# Patient Record
Sex: Male | Born: 1970 | Race: Black or African American | Hispanic: No | Marital: Single | Smoking: Never smoker
Health system: Southern US, Community
[De-identification: ages and names within clinical notes are randomized; demographics above are authoritative.]

## PROBLEM LIST (undated history)

## (undated) HISTORY — PX: LUNG SURGERY: SHX703

---

## 2010-02-23 ENCOUNTER — Emergency Department (HOSPITAL_COMMUNITY): Admission: EM | Admit: 2010-02-23 | Discharge: 2010-02-23 | Payer: Self-pay | Admitting: Emergency Medicine

## 2014-05-28 ENCOUNTER — Emergency Department (HOSPITAL_COMMUNITY)
Admission: EM | Admit: 2014-05-28 | Discharge: 2014-05-29 | Disposition: A | Discharge status: Home / Self Care | Payer: Self-pay | Attending: Emergency Medicine | Admitting: Emergency Medicine

## 2014-05-28 ENCOUNTER — Emergency Department (HOSPITAL_COMMUNITY): Payer: Self-pay

## 2014-05-28 ENCOUNTER — Encounter (HOSPITAL_COMMUNITY): Payer: Self-pay | Admitting: Emergency Medicine

## 2014-05-28 DIAGNOSIS — R079 Chest pain, unspecified: Secondary | ICD-10-CM

## 2014-05-28 DIAGNOSIS — M546 Pain in thoracic spine: Secondary | ICD-10-CM | POA: Insufficient documentation

## 2014-05-28 LAB — I-STAT TROPONIN, ED: Troponin i, poc: 0 ng/mL (ref 0.00–0.08)

## 2014-05-28 LAB — CBC
HCT: 45.1 % (ref 39.0–52.0)
Hemoglobin: 15.3 g/dL (ref 13.0–17.0)
MCH: 32.1 pg (ref 26.0–34.0)
MCHC: 33.9 g/dL (ref 30.0–36.0)
MCV: 94.7 fL (ref 78.0–100.0)
Platelets: 219 10*3/uL (ref 150–400)
RBC: 4.76 MIL/uL (ref 4.22–5.81)
RDW: 11.8 % (ref 11.5–15.5)
WBC: 6.3 10*3/uL (ref 4.0–10.5)

## 2014-05-28 LAB — D-DIMER, QUANTITATIVE (NOT AT ARMC)

## 2014-05-28 LAB — BASIC METABOLIC PANEL
Anion gap: 11 (ref 5–15)
BUN: 13 mg/dL (ref 6–23)
CALCIUM: 9.6 mg/dL (ref 8.4–10.5)
CO2: 27 mmol/L (ref 19–32)
CREATININE: 1.2 mg/dL (ref 0.50–1.35)
Chloride: 105 mmol/L (ref 96–112)
GFR calc Af Amer: 84 mL/min — ABNORMAL LOW (ref >90)
GFR calc non Af Amer: 72 mL/min — ABNORMAL LOW (ref >90)
Glucose, Bld: 114 mg/dL — ABNORMAL HIGH (ref 70–99)
Potassium: 3.5 mmol/L (ref 3.5–5.1)
Sodium: 143 mmol/L (ref 135–145)

## 2014-05-28 NOTE — ED Notes (Addendum)
Pt declined cardiac monitor.  Pt stated that he wanted to make sure he was all right and then leave.

## 2014-05-28 NOTE — ED Provider Notes (Signed)
CSN: [id number]     Arrival date & time 05/28/14  2126 History   First MD Initiated Contact with Patient 05/28/14 2158     Chief Complaint  Patient presents with  . Chest Pain     (Consider location/radiation/quality/duration/timing/severity/associated sxs/prior Treatment) HPI  44 year old male presents with left-sided back pain and lateral chest wall pain over the past 12 hours. Started while he was at work. Patient is a Education administratorpainter and feels like any type of movement exacerbates the pain. He did not specifically lift or strain anything that would cause his pain. Patient denies a shortness of breath. Pain feels similar to when he had a collapsed lung several years ago. Denies any cough or fevers. No leg swelling or leg pain. No prior history of blood clots. Patient's pain is a 5/10. He declines any pain medicines. Pain is somewhat worse with inspiration.  History reviewed. No pertinent past medical history. Past Surgical History  Procedure Laterality Date  . Lung surgery     No family history on file. History  Substance Use Topics  . Smoking status: Never Smoker   . Smokeless tobacco: Not on file  . Alcohol Use: No    Review of Systems  Constitutional: Negative for fever.  Respiratory: Negative for cough and shortness of breath.   Cardiovascular: Positive for chest pain.  Gastrointestinal: Negative for vomiting and abdominal pain.  Musculoskeletal: Positive for back pain.  All other systems reviewed and are negative.     Allergies  Hydrocodone  Home Medications   Prior to Admission medications   Medication Sig Start Date End Date Taking? Authorizing Provider  ASA-APAP-Salicyl-Caff TABS Take 2 tablets by mouth daily as needed (pain).   Yes Historical Provider, MD   BP 149/110 mmHg  Pulse 95  Temp(Src) 97.8 F (36.6 C) (Oral)  Resp 21  SpO2 98% Physical Exam  Constitutional: He is oriented to person, place, and time. He appears well-developed and well-nourished.  HENT:   Head: Normocephalic and atraumatic.  Right Ear: External ear normal.  Left Ear: External ear normal.  Nose: Nose normal.  Eyes: Right eye exhibits no discharge. Left eye exhibits no discharge.  Neck: Neck supple.  Cardiovascular: Normal rate, regular rhythm, normal heart sounds and intact distal pulses.   HR right around 100  Pulmonary/Chest: Effort normal and breath sounds normal. He exhibits no tenderness.  Abdominal: Soft. He exhibits no distension. There is no tenderness.  Musculoskeletal: He exhibits no edema.       Thoracic back: He exhibits tenderness. He exhibits no bony tenderness.       Back:  Neurological: He is alert and oriented to person, place, and time.  Skin: Skin is warm and dry.  Nursing note and vitals reviewed.   ED Course  Procedures (including critical care time) Labs Review Labs Reviewed  BASIC METABOLIC PANEL - Abnormal; Notable for the following:    Glucose, Bld 114 (*)    GFR calc non Af Amer 72 (*)    GFR calc Af Amer 84 (*)    All other components within normal limits  CBC  D-DIMER, QUANTITATIVE  I-STAT TROPOININ, ED    Imaging Review Dg Chest 2 View  05/28/2014   CLINICAL DATA:  Intermittent chest pain and shortness of breath. Initial encounter.  EXAM: CHEST  2 VIEW  COMPARISON:  None.  FINDINGS: The lungs are well-aerated and clear. There is no evidence of focal opacification, pleural effusion or pneumothorax.  The heart is normal in size; the  mediastinal contour is within normal limits. No acute osseous abnormalities are seen.  IMPRESSION: No acute cardiopulmonary process seen.   Electronically Signed   By: Roanna Raider M.D.   On: 05/28/2014 21:58     EKG Interpretation   Date/Time:  Tuesday May 28 2014 21:35:10 EST Ventricular Rate:  90 PR Interval:  391 QRS Duration: 183 QT Interval:  457 QTC Calculation: 559 R Axis:   75 Text Interpretation:  Normal sinus rhythm Ventricular premature complex  Prolonged PR interval Left atrial  enlargement Nonspecific intraventricular  conduction delay Baseline wander in lead(s) V2 V6 No old tracing to  compare Confirmed by Melvie Paglia  MD, Johnetta Sloniker [phone]) on 05/28/2014 10:00:03 PM       EKG Interpretation  Date/Time:  Tuesday May 28 2014 23:17:28 EST Ventricular Rate:  77 PR Interval:  136 QRS Duration: 102 QT Interval:  396 QTC Calculation: 448 R Axis:   54 Text Interpretation:  Normal sinus rhythm Probable left ventricular hypertrophy ST elevation suggests acute pericarditis no significant change since earlier in the night Confirmed by Zaiah Eckerson  MD, Hayden Mabin ([id number]) on 05/28/2014 11:26:50 PM       MDM   Final diagnoses:  Left-sided thoracic back pain    Patient's back pain appears to be musculoskeletal. 2 EKGs are unchanged and show no signs of ischemia. Given his acute onset, second troponin was ordered to rule out ACS. Care transferred to Dr. Mora Bellman with 2nd troponin pending. Patient continues to decline pain meds in ED. Low risk for PE, but given pain with inspiration a ddimer was sent and is negative. No further workup indicated.    Audree Camel, MD 05/28/14 [phone]

## 2014-05-28 NOTE — ED Notes (Signed)
Pt from home c/o intermittentcentralized chest pain since this morning. He reports some shortness of breath and pain with movement.He reports taking 2 chewable medications but does not recall med and states that is not ASA. He also reports previous lung surgery on left .

## 2014-05-29 LAB — I-STAT TROPONIN, ED: Troponin i, poc: 0 ng/mL (ref 0.00–0.08)

## 2015-09-10 IMAGING — CR DG CHEST 2V
2 series · 2 of 2 positions shown · non-contrast
Comparison: None.

CLINICAL DATA: Intermittent chest pain and shortness of breath.
Initial encounter.

EXAM:
CHEST  2 VIEW

[w chest pa]
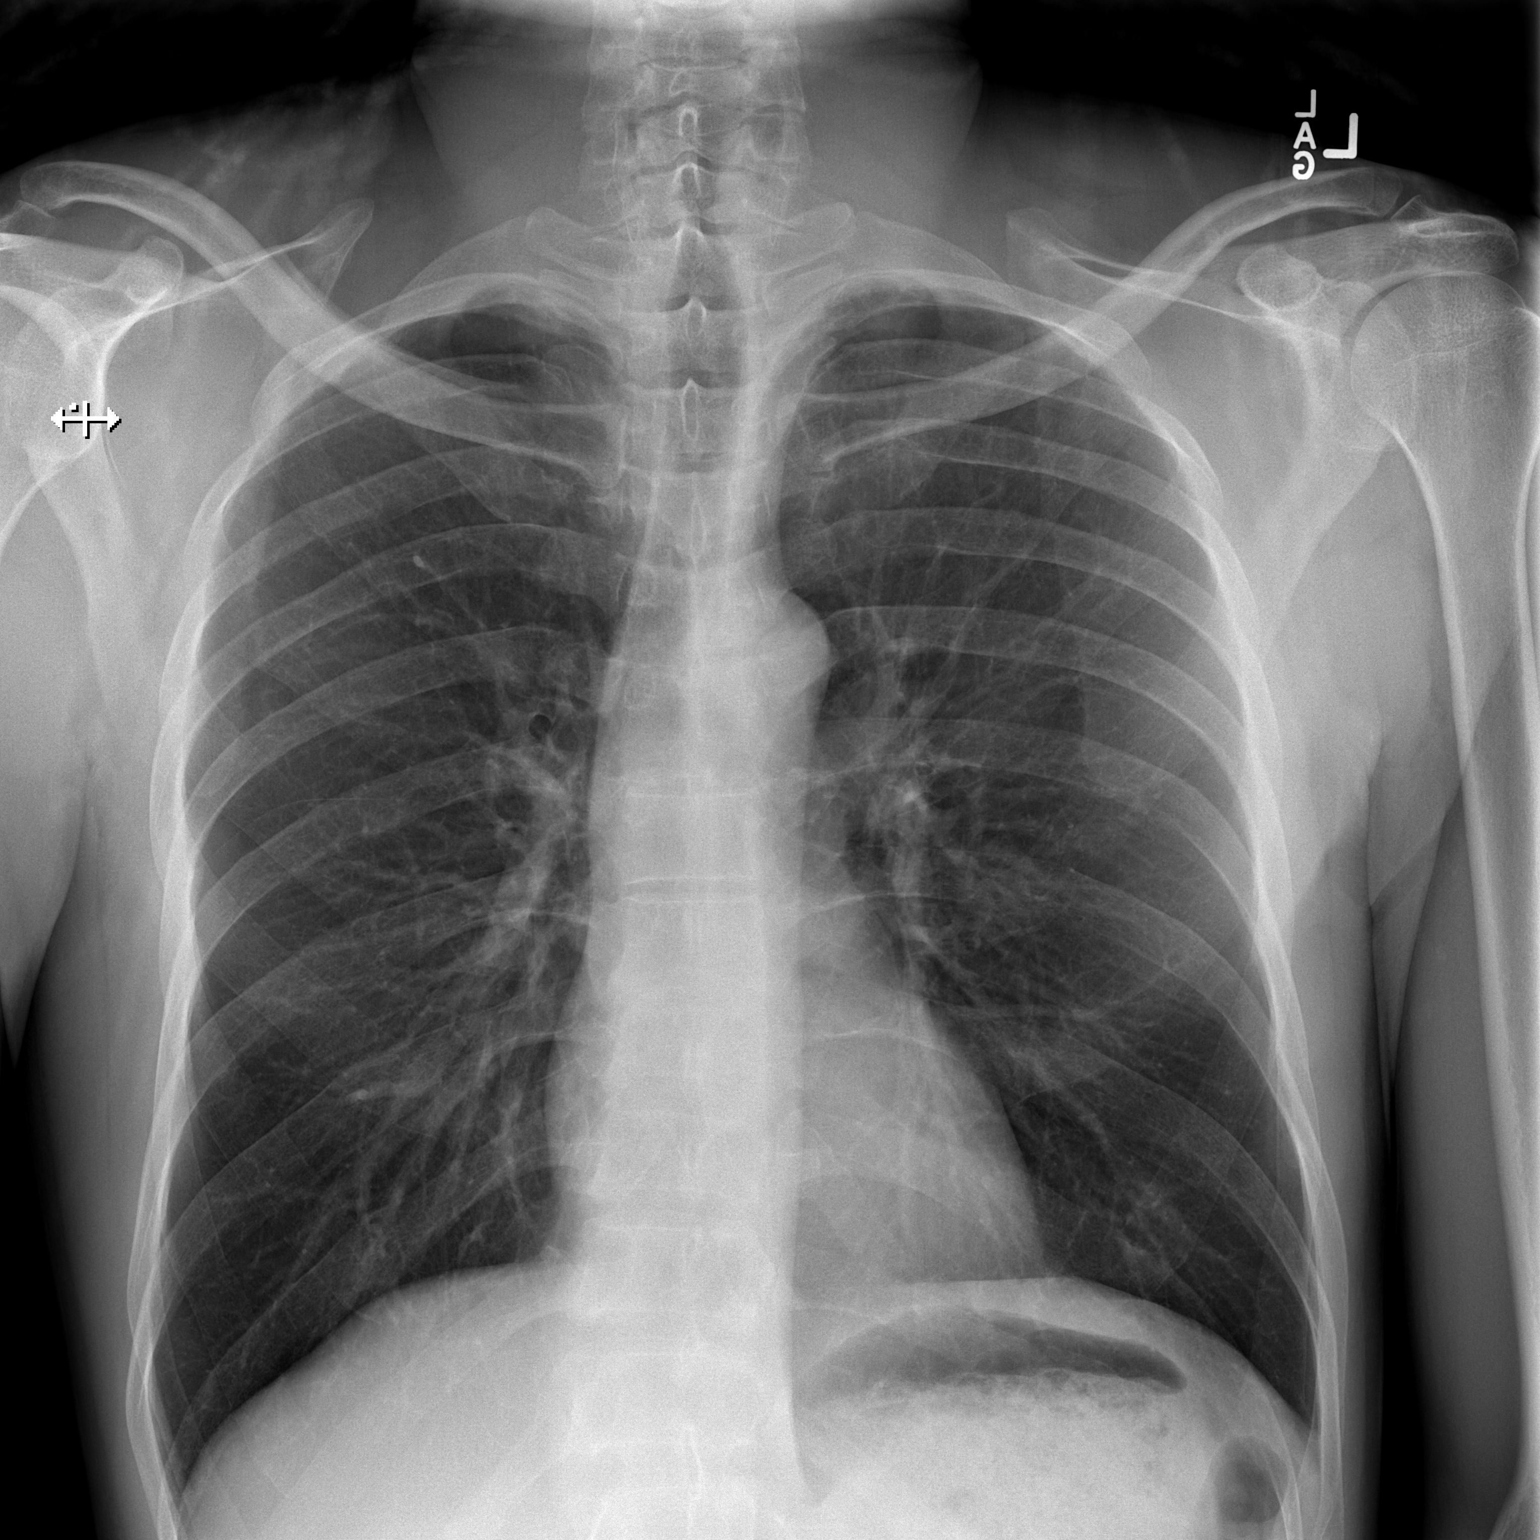

[w chest lat]
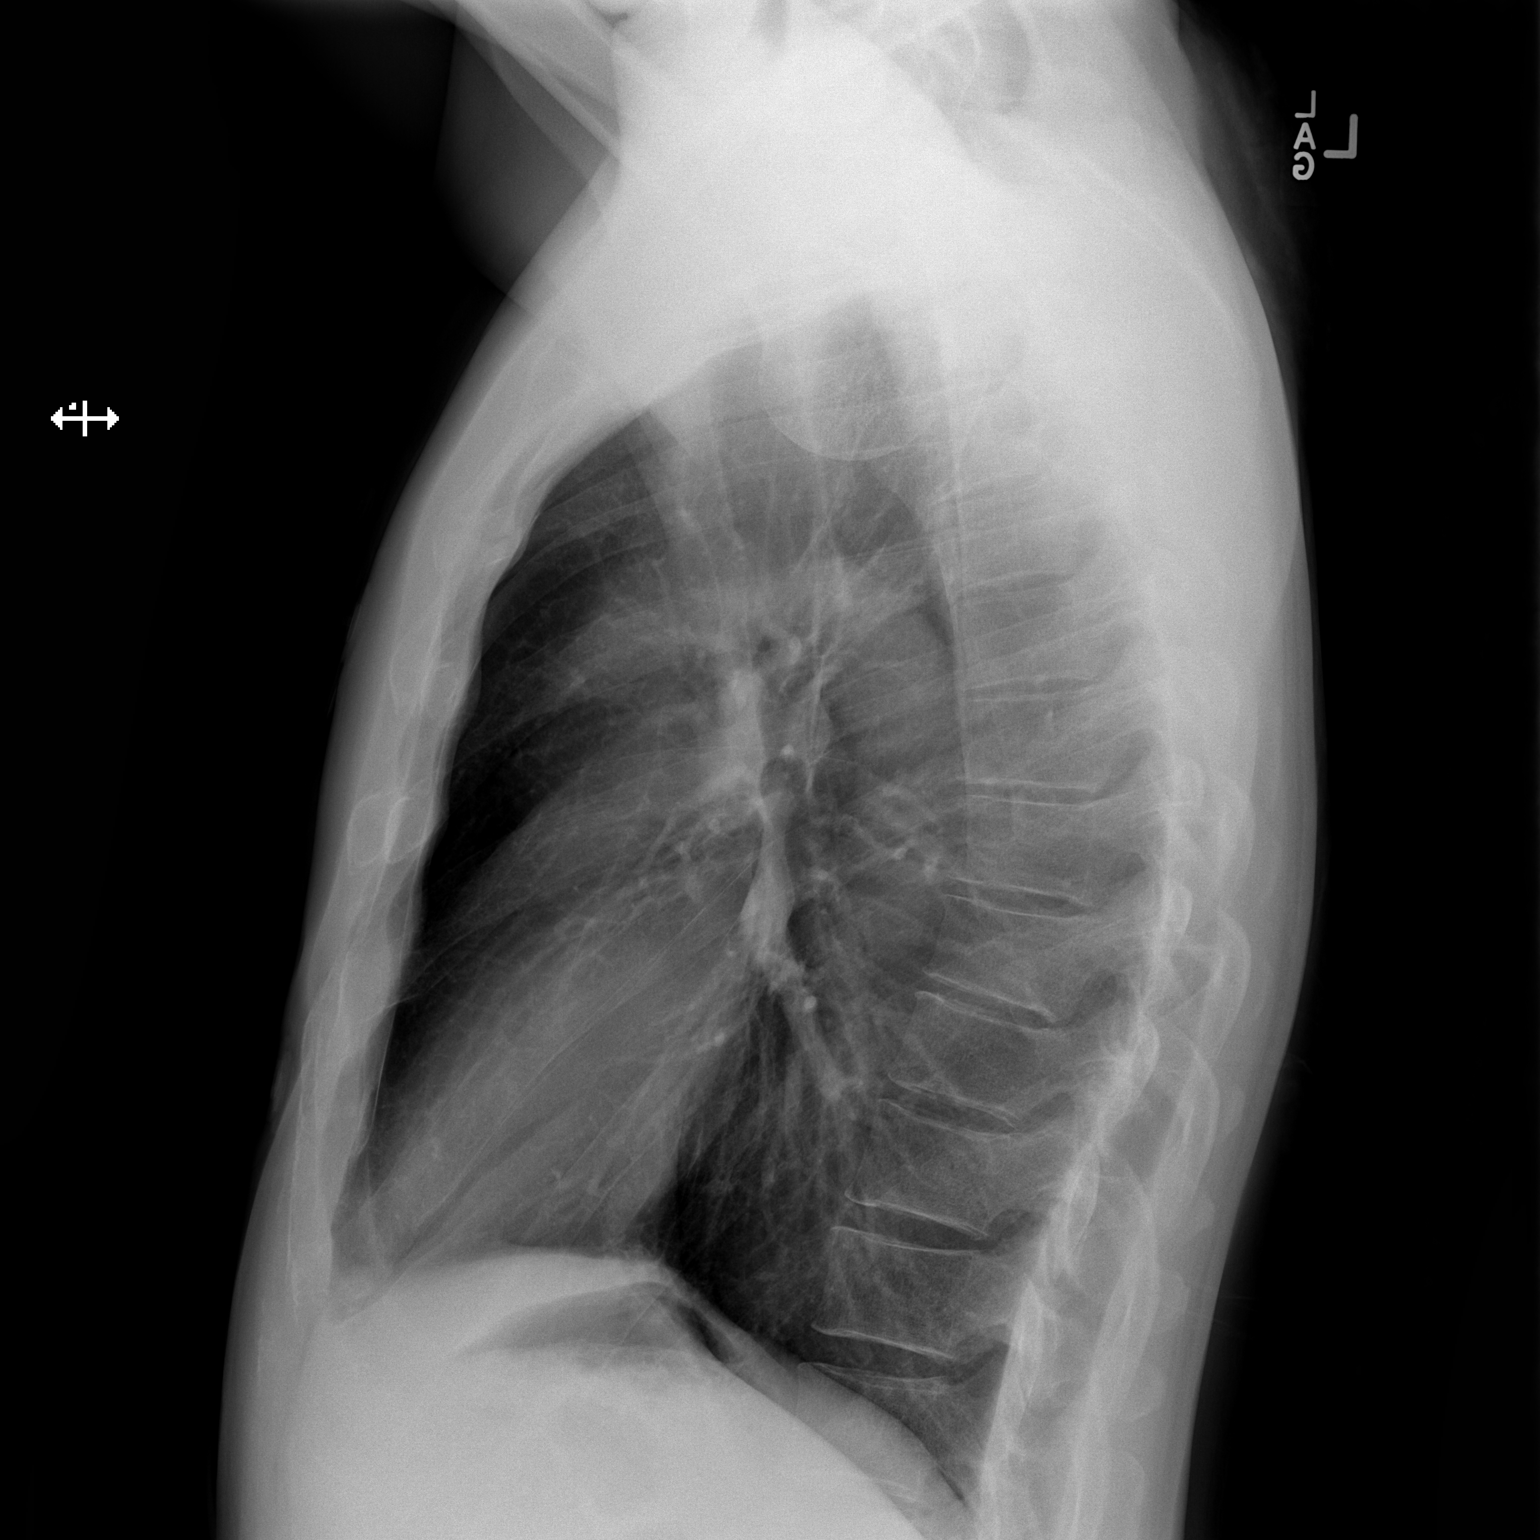

[2 of 2 positions shown; findings below may reference images not displayed]

FINDINGS: The lungs are well-aerated and clear. There is no evidence of focal
opacification, pleural effusion or pneumothorax.

The heart is normal in size; the mediastinal contour is within
normal limits. No acute osseous abnormalities are seen.
IMPRESSION: No acute cardiopulmonary process seen.
